# Patient Record
Sex: Male | Born: 1980 | Race: White | Smoking: Never smoker
Health system: Southern US, Community
[De-identification: ages and names within clinical notes are randomized; demographics above are authoritative.]

---

## 2010-09-10 ENCOUNTER — Emergency Department (HOSPITAL_COMMUNITY)
Admission: EM | Admit: 2010-09-10 | Discharge: 2010-09-10 | Disposition: A | Discharge status: Home / Self Care | Payer: Commercial Indemnity | Attending: Emergency Medicine | Admitting: Emergency Medicine

## 2010-09-10 ENCOUNTER — Emergency Department (HOSPITAL_COMMUNITY): Payer: Commercial Indemnity

## 2010-09-10 DIAGNOSIS — IMO0002 Reserved for concepts with insufficient information to code with codable children: Secondary | ICD-10-CM | POA: Insufficient documentation

## 2010-09-10 DIAGNOSIS — R229 Localized swelling, mass and lump, unspecified: Secondary | ICD-10-CM | POA: Insufficient documentation

## 2010-09-10 DIAGNOSIS — S0280XA Fracture of other specified skull and facial bones, unspecified side, initial encounter for closed fracture: Secondary | ICD-10-CM | POA: Insufficient documentation

## 2010-09-10 DIAGNOSIS — S02401A Maxillary fracture, unspecified, initial encounter for closed fracture: Secondary | ICD-10-CM | POA: Insufficient documentation

## 2010-09-10 DIAGNOSIS — R51 Headache: Secondary | ICD-10-CM | POA: Insufficient documentation

## 2010-09-10 DIAGNOSIS — W219XXA Striking against or struck by unspecified sports equipment, initial encounter: Secondary | ICD-10-CM | POA: Insufficient documentation

## 2010-09-10 DIAGNOSIS — Y929 Unspecified place or not applicable: Secondary | ICD-10-CM | POA: Insufficient documentation

## 2010-09-10 DIAGNOSIS — S02400A Malar fracture unspecified, initial encounter for closed fracture: Secondary | ICD-10-CM | POA: Insufficient documentation

## 2012-03-17 IMAGING — CT CT MAXILLOFACIAL W/O CM
3 series · 16 of 47 positions shown, 19 images · non-contrast
Comparison: None

CLINICAL DATA: Hit with softball left eye

CT MAXILLOFACIAL WITHOUT CONTRAST
TECHNIQUE: Multidetector CT imaging of the maxillofacial
structures was performed. Multiplanar CT image reconstructions were
also generated.

[Series 2: facial bones · axial · 0.43mm/px · z∈[+33,+191]mm · 10 of 93 slices shown, 13 images]
[im 7/93  brain]
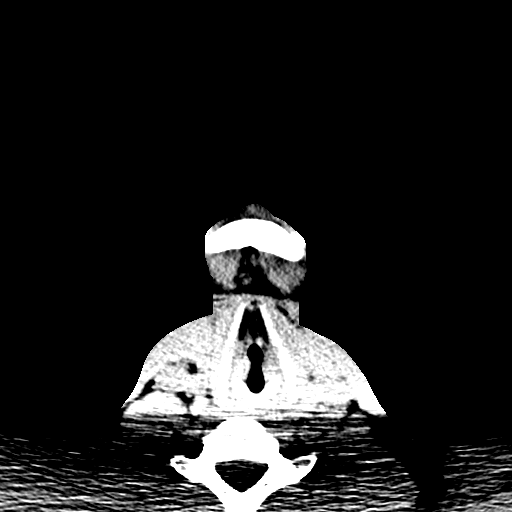
[im 7/93  bone]
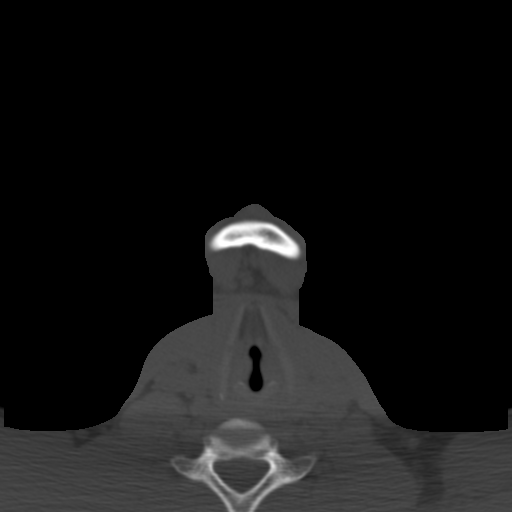
[im 16/93  bone]
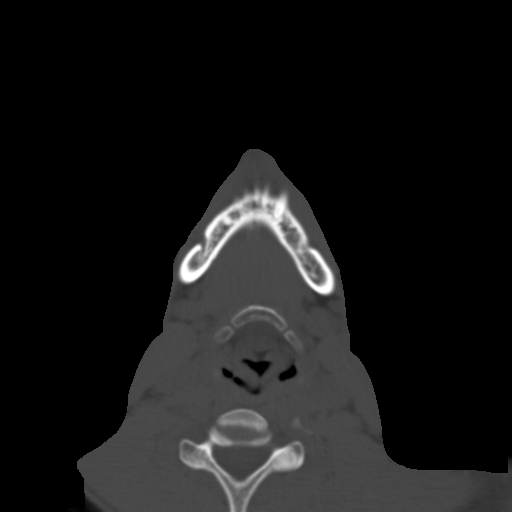
[im 26/93  bone]
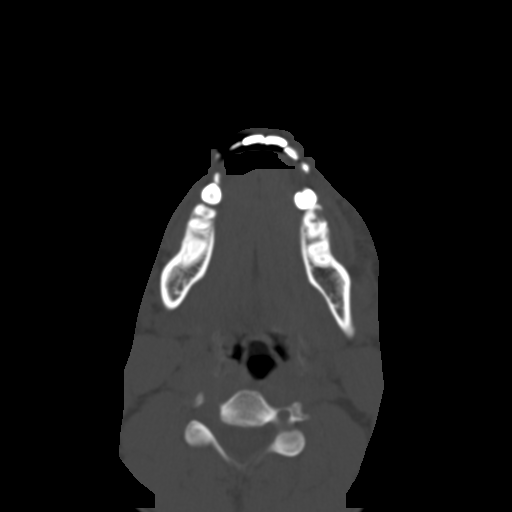
[im 32/93  bone]
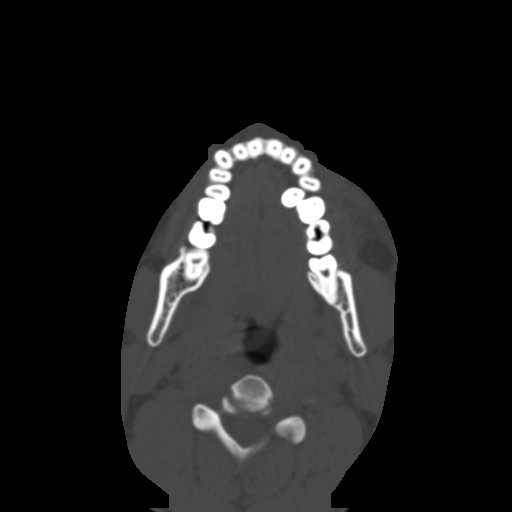
[im 42/93  brain]
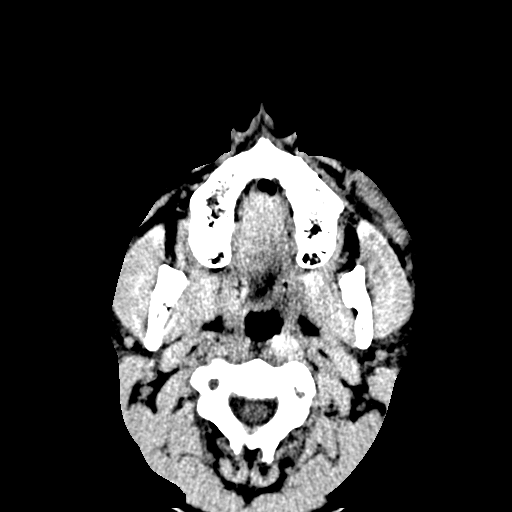
[im 42/93  bone]
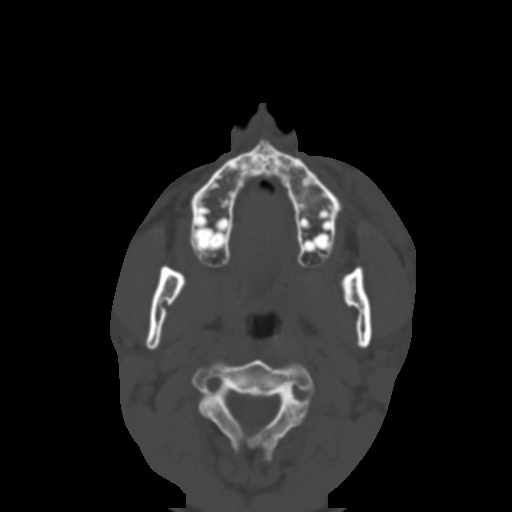
[im 51/93  bone]
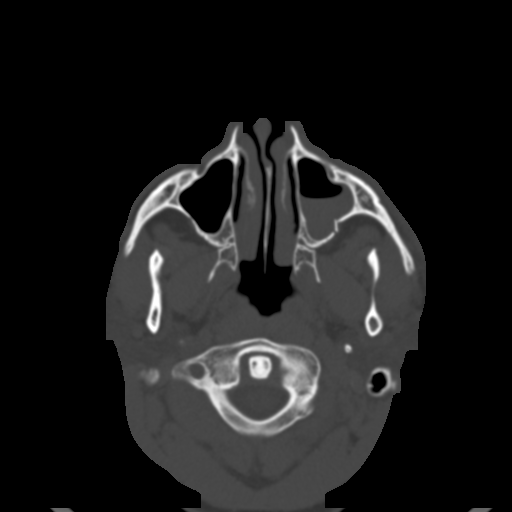
[im 61/93  bone]
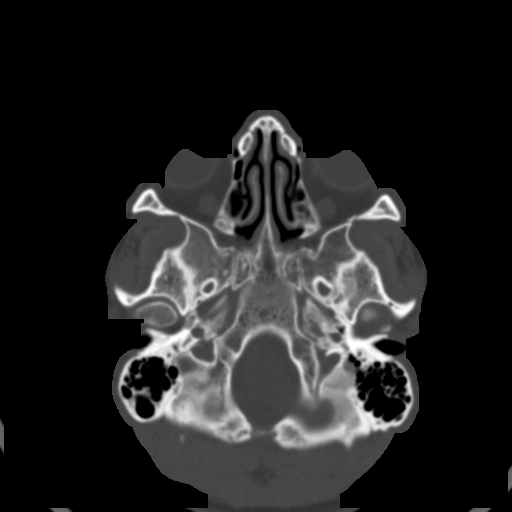
[im 70/93  bone]
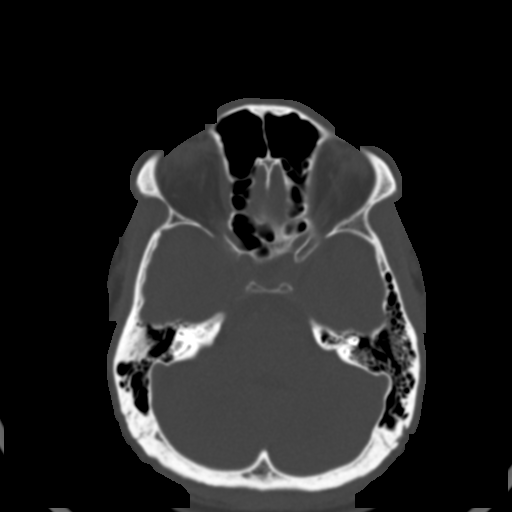
[im 77/93  brain]
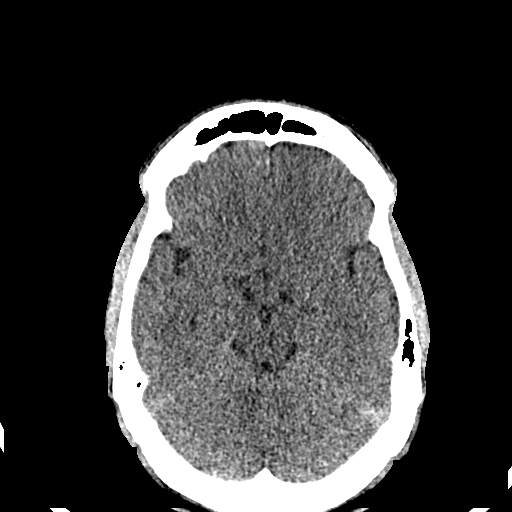
[im 77/93  bone]
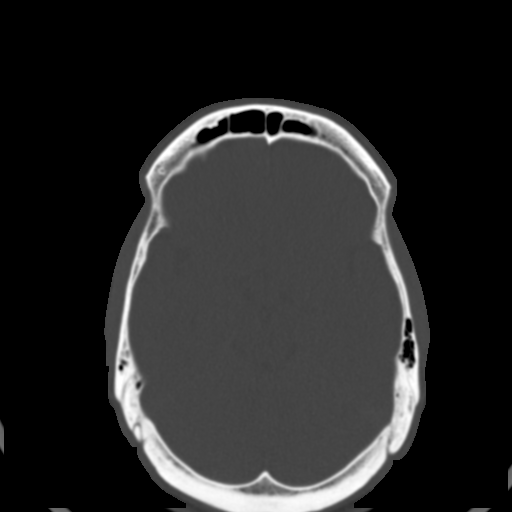
[im 86/93  bone]
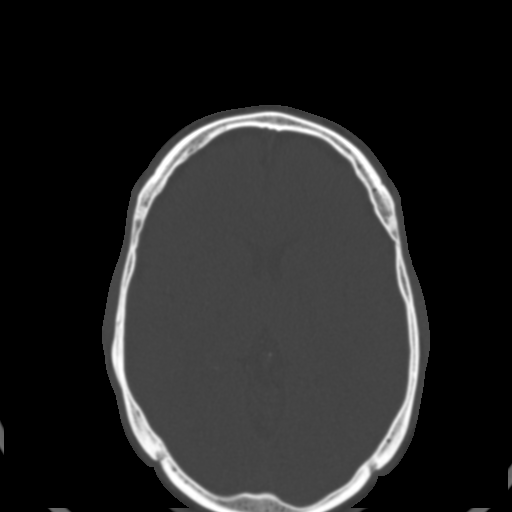

[mpr, coronal std, coronal · coronal · 0.43mm/px · 3 of 88 slices shown]
[im 30/88  bone]
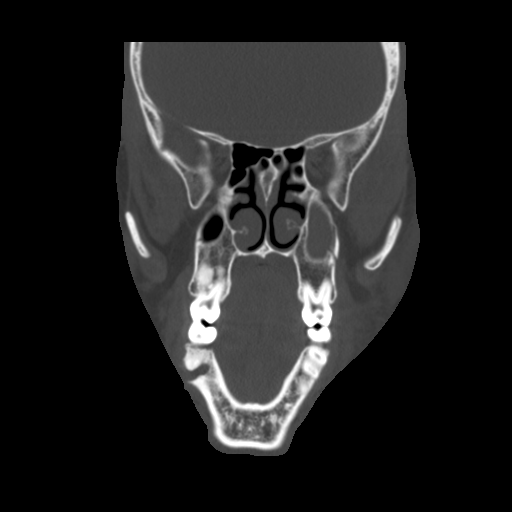
[im 39/88  bone]
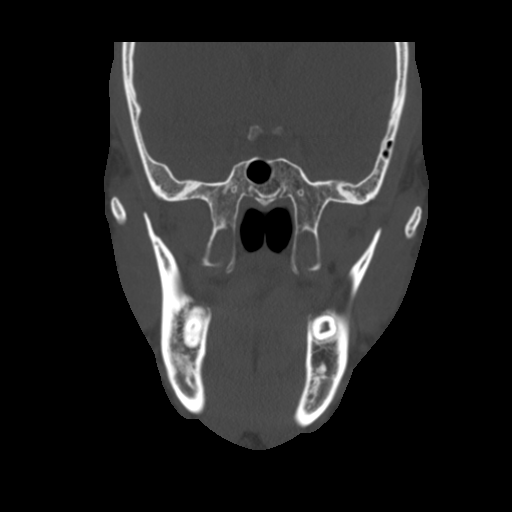
[im 49/88  bone]
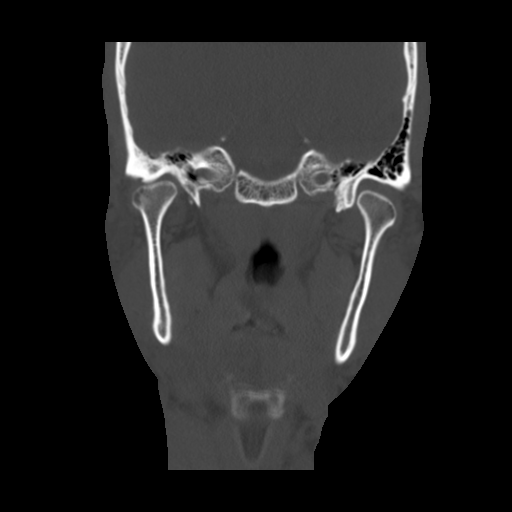

[mpr, sagittal std, sagittal · sagittal · 0.43mm/px · 3 of 71 slices shown]
[im 24/71  bone]
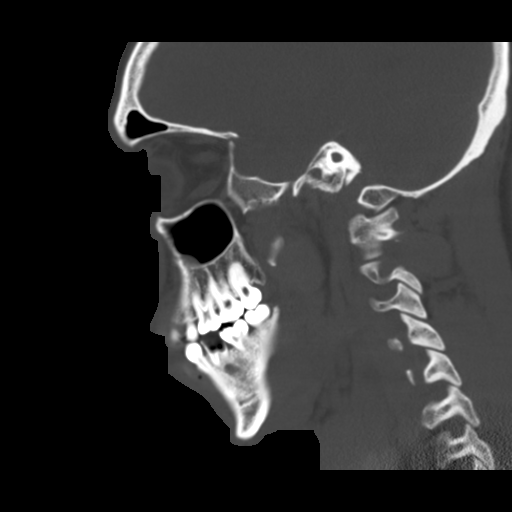
[im 36/71  bone]
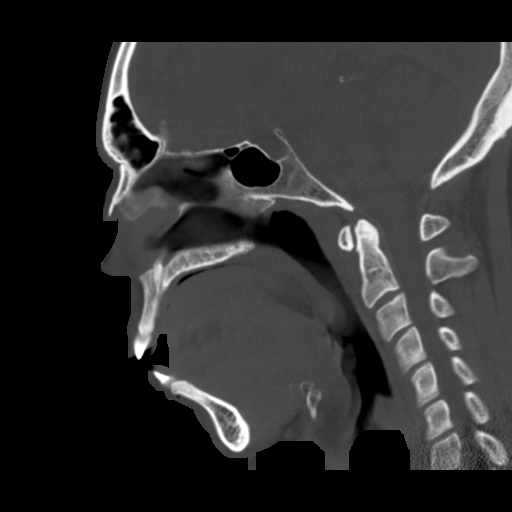
[im 47/71  bone]
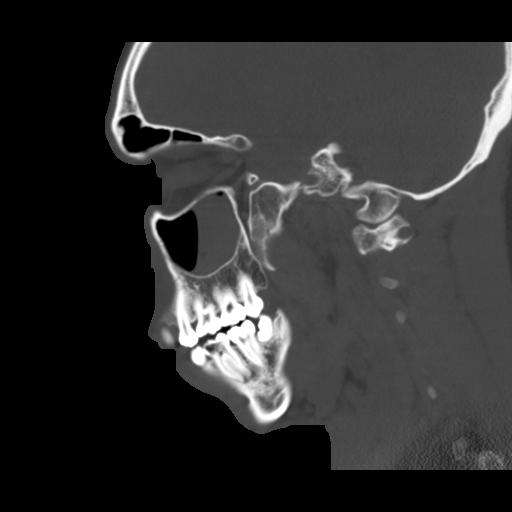

[16 of 47 positions shown; findings below may reference images not displayed]

FINDINGS: Multiple fractures involving the left face.  Nondisplaced
fractures of the zygomatic arch anteriorly and posteriorly.  Mildly
comminuted fracture of the lateral wall of the left maxillary
sinus.  Nondisplaced fracture of the anterior wall left maxillary
sinus.  Nondisplaced fracture of the left lateral orbit.  No
fracture of the orbital floor is identified.  There is  blood in
the left maxillary sinus.

Negative for fracture of the mandible.  Multiple dental caries are
present as well as periapical abscess around right lower molar.
IMPRESSION: Multiple left-sided facial fractures.

Dental disease.

## 2022-09-28 ENCOUNTER — Ambulatory Visit (HOSPITAL_BASED_OUTPATIENT_CLINIC_OR_DEPARTMENT_OTHER): Payer: Commercial Indemnity | Admitting: Family Medicine

## 2022-10-24 ENCOUNTER — Ambulatory Visit (INDEPENDENT_AMBULATORY_CARE_PROVIDER_SITE_OTHER): Payer: BC Managed Care – PPO

## 2022-10-24 ENCOUNTER — Other Ambulatory Visit: Payer: Self-pay

## 2022-10-24 ENCOUNTER — Ambulatory Visit (HOSPITAL_COMMUNITY)
Admission: EM | Admit: 2022-10-24 | Discharge: 2022-10-24 | Disposition: A | Discharge status: Home / Self Care | Payer: BC Managed Care – PPO | Attending: Urgent Care | Admitting: Urgent Care

## 2022-10-24 ENCOUNTER — Encounter (HOSPITAL_COMMUNITY): Payer: Self-pay | Admitting: *Deleted

## 2022-10-24 ENCOUNTER — Inpatient Hospital Stay (HOSPITAL_COMMUNITY)
Admission: RE | Admit: 2022-10-24 | Discharge: 2022-10-24 | Disposition: A | Discharge status: Home / Self Care | Payer: BC Managed Care – PPO | Source: Ambulatory Visit

## 2022-10-24 DIAGNOSIS — R0602 Shortness of breath: Secondary | ICD-10-CM | POA: Insufficient documentation

## 2022-10-24 DIAGNOSIS — R0789 Other chest pain: Secondary | ICD-10-CM

## 2022-10-24 DIAGNOSIS — M5410 Radiculopathy, site unspecified: Secondary | ICD-10-CM | POA: Diagnosis not present

## 2022-10-24 DIAGNOSIS — M792 Neuralgia and neuritis, unspecified: Secondary | ICD-10-CM

## 2022-10-24 DIAGNOSIS — M791 Myalgia, unspecified site: Secondary | ICD-10-CM | POA: Diagnosis not present

## 2022-10-24 DIAGNOSIS — M79602 Pain in left arm: Secondary | ICD-10-CM | POA: Insufficient documentation

## 2022-10-24 DIAGNOSIS — R001 Bradycardia, unspecified: Secondary | ICD-10-CM | POA: Diagnosis not present

## 2022-10-24 LAB — COMPREHENSIVE METABOLIC PANEL
ALT: 26 U/L (ref 0–44)
AST: 22 U/L (ref 15–41)
Albumin: 3.8 g/dL (ref 3.5–5.0)
Alkaline Phosphatase: 53 U/L (ref 38–126)
Anion gap: 7 (ref 5–15)
BUN: 12 mg/dL (ref 6–20)
CO2: 28 mmol/L (ref 22–32)
Calcium: 9 mg/dL (ref 8.9–10.3)
Chloride: 105 mmol/L (ref 98–111)
Creatinine, Ser: 1.1 mg/dL (ref 0.61–1.24)
GFR, Estimated: >60 mL/min (ref >60)
Glucose, Bld: 100 mg/dL — ABNORMAL HIGH (ref 70–99)
Potassium: 4.1 mmol/L (ref 3.5–5.1)
Sodium: 140 mmol/L (ref 135–145)
Total Bilirubin: 1.1 mg/dL (ref 0.3–1.2)
Total Protein: 6.6 g/dL (ref 6.5–8.1)

## 2022-10-24 LAB — CBC WITH DIFFERENTIAL/PLATELET
Abs Immature Granulocytes: 0.01 10*3/uL (ref 0.00–0.07)
Basophils Absolute: 0.1 10*3/uL (ref 0.0–0.1)
Basophils Relative: 1 %
Eosinophils Absolute: 0.1 10*3/uL (ref 0.0–0.5)
Eosinophils Relative: 3 %
HCT: 44.1 % (ref 39.0–52.0)
Hemoglobin: 14.1 g/dL (ref 13.0–17.0)
Immature Granulocytes: 0 %
Lymphocytes Relative: 51 %
Lymphs Abs: 2.1 10*3/uL (ref 0.7–4.0)
MCH: 28.2 pg (ref 26.0–34.0)
MCHC: 32 g/dL (ref 30.0–36.0)
MCV: 88.2 fL (ref 80.0–100.0)
Monocytes Absolute: 0.4 10*3/uL (ref 0.1–1.0)
Monocytes Relative: 9 %
Neutro Abs: 1.5 10*3/uL — ABNORMAL LOW (ref 1.7–7.7)
Neutrophils Relative %: 36 %
Platelets: 258 10*3/uL (ref 150–400)
RBC: 5 MIL/uL (ref 4.22–5.81)
RDW: 13.2 % (ref 11.5–15.5)
WBC: 4.1 10*3/uL (ref 4.0–10.5)
nRBC: 0 % (ref 0.0–0.2)

## 2022-10-24 LAB — TSH: TSH: 1.28 u[IU]/mL (ref 0.350–4.500)

## 2022-10-24 LAB — SEDIMENTATION RATE: Sed Rate: 2 mm/hr (ref 0–16)

## 2022-10-24 LAB — C-REACTIVE PROTEIN: CRP: <0.5 mg/dL (ref <1.0)

## 2022-10-24 MED ORDER — PREDNISONE 10 MG (21) PO TBPK: ORAL_TABLET | Freq: Every day | ORAL | 0 refills | Status: AC

## 2022-10-24 NOTE — Discharge Instructions (Addendum)
Your EKG appears normal. Your chest x-ray does not show any acute findings. We drew labs to further assess the cause of your discomfort today.  Please apply a moist warm compress such as a microwavable heat pack to your anterior chest intermittently throughout the day. Please try the Dosepak of steroids to see if this helps with the swelling and inflammation.  Please establish care with a primary care physician at your earliest convenience to further evaluate your symptoms.  I would recommend a sleep study as obstructive sleep apnea may cause some of your symptoms as well.

## 2022-10-24 NOTE — ED Triage Notes (Signed)
Pt reports on Friday he woke  up in the middle of the night with SHOB ,CP and tingling in Lt arm.

## 2022-10-24 NOTE — ED Provider Notes (Signed)
[facility]    CSN: [id number] Arrival date & time: 10/24/22  0849      History   Chief Complaint Chief Complaint  Patient presents with   Shortness of Breath   Muscle Pain    HPI Shawn Neal is a 42 y.o. male.   42 year old male presents today due to concerns of shortness of breath, chest pain, and numbness tingling down his left arm.  Patient states it has been going on intermittently for the past 2 months he believes.  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  He denies any injury.  He states that the pain is occurring primarily to his sternum bilaterally, and is happening daily.  He reports it as a pressure.  Some days it will last an hour or so, other days it will last all day.  This pressure is associated with a tingling that occurs down his left arm.  It does not extend into his wrist or hand.  He denies weakness of his extremity he is not dropping things.  He states he will not get the tingling and less he is also getting the chest pressure.  Additionally patient states he is intermittently short of breath.  He will have episodes in which he needs to sit up abruptly to improve his shortness of breath sensation.  He states however his shortness of breath will occur at rest.  It is not necessarily exertional or positional.  He denies any recent viral illness.  He denies any recent COVID infection.  He has not had a fever.  He denies a cough.  He has not tried any treatments for this.  He does not smoke.  He is otherwise healthy and has no chronic medical conditions and takes no prescription or over-the-counter medications.   Shortness of Breath Muscle Pain Associated symptoms include shortness of breath.    History reviewed. No pertinent past medical history.  There are no problems to display for this patient.   History reviewed. No pertinent surgical history.     Home Medications    Prior to Admission medications    Medication Sig Start Date End Date Taking? Authorizing Provider  predniSONE (STERAPRED UNI-PAK 21 TAB) 10 MG (21) TBPK tablet Take by mouth daily. Take 6 tabs by mouth daily  for 1 days, then 5 tabs for 1 days, then 4 tabs for 1 days, then 3 tabs for 1 days, 2 tabs for 1 days, then 1 tab by mouth daily for 1 days 10/24/22  Yes Ramie Erman L, PA    Family History History reviewed. No pertinent family history.  Social History Social History   Tobacco Use   Smoking status: Never   Smokeless tobacco: Never     Allergies   Patient has no known allergies.   Review of Systems Review of Systems  Respiratory:  Positive for shortness of breath.   As per HPI   Physical Exam Triage Vital Signs ED Triage Vitals  Enc Vitals Group     BP 10/24/22 0907 126/89     Pulse Rate 10/24/22 0907 (!) 57     Resp 10/24/22 0907 18     Temp 10/24/22 0907 97.8 F (36.6 C)     Temp src --      SpO2 10/24/22 0907 97 %     Weight --      Height --      Head Circumference --      Peak  Flow --      Pain Score 10/24/22 0905 5     Pain Loc --      Pain Edu? --      Excl. in GC? --    No data found.  Updated Vital Signs BP 126/89   Pulse (!) 57   Temp 97.8 F (36.6 C)   Resp 18   SpO2 97%   Visual Acuity Right Eye Distance:   Left Eye Distance:   Bilateral Distance:    Right Eye Near:   Left Eye Near:    Bilateral Near:     Physical Exam Vitals and nursing note reviewed. Exam conducted with a chaperone present.  Constitutional:      General: He is not in acute distress.    Appearance: He is well-developed and normal weight. He is not ill-appearing, toxic-appearing or diaphoretic.  HENT:     Head: Normocephalic and atraumatic.     Right Ear: Tympanic membrane, ear canal and external ear normal. There is no impacted cerumen.     Left Ear: Tympanic membrane, ear canal and external ear normal. There is no impacted cerumen.     Nose: Nose normal. No congestion or rhinorrhea.      Mouth/Throat:     Mouth: Mucous membranes are moist.     Pharynx: Oropharynx is clear. No oropharyngeal exudate or posterior oropharyngeal erythema.     Comments: Mallampati class 3 Eyes:     General: No scleral icterus.       Right eye: No discharge.        Left eye: No discharge.     Extraocular Movements: Extraocular movements intact.     Conjunctiva/sclera: Conjunctivae normal.     Pupils: Pupils are equal, round, and reactive to light.  Cardiovascular:     Rate and Rhythm: Normal rate and regular rhythm.     Heart sounds: No murmur heard.    Comments: HR between 53-61 upon exam Pulmonary:     Effort: Pulmonary effort is normal. No respiratory distress.     Breath sounds: Normal breath sounds. No stridor. No wheezing, rhonchi or rales.  Chest:     Chest wall: Tenderness (reproducible tenderness to B anterior chest/ sternum) present.  Abdominal:     Palpations: Abdomen is soft.     Tenderness: There is no abdominal tenderness.  Musculoskeletal:        General: No swelling. Normal range of motion.     Right shoulder: Normal. No tenderness or bony tenderness. Normal range of motion. Normal strength. Normal pulse.     Left shoulder: Normal. No tenderness or bony tenderness. Normal range of motion. Normal strength. Normal pulse.     Right upper arm: Normal. No swelling, tenderness or bony tenderness.     Left upper arm: Normal. No swelling, tenderness or bony tenderness.     Right wrist: Normal.     Left wrist: Normal. No swelling. Normal pulse.     Right hand: Normal. No tenderness. Normal range of motion. Normal strength. Normal sensation.     Left hand: Normal. No tenderness. Normal range of motion. Normal strength. Normal sensation.     Cervical back: Normal range of motion and neck supple. No rigidity or tenderness.     Right lower leg: No edema.     Left lower leg: No edema.  Lymphadenopathy:     Cervical: No cervical adenopathy.  Skin:    General: Skin is warm and dry.      Capillary  Refill: Capillary refill takes less than 2 seconds.     Coloration: Skin is not jaundiced.     Findings: No bruising, erythema or rash.  Neurological:     General: No focal deficit present.     Mental Status: He is alert and oriented to person, place, and time.     Cranial Nerves: No cranial nerve deficit.     Sensory: No sensory deficit.     Motor: No weakness.     Gait: Gait normal.  Psychiatric:        Mood and Affect: Mood normal.      UC Treatments / Results  Labs (all labs ordered are listed, but only abnormal results are displayed) Labs Reviewed  CBC WITH DIFFERENTIAL/PLATELET  COMPREHENSIVE METABOLIC PANEL  TSH  SEDIMENTATION RATE  C-REACTIVE PROTEIN    EKG   Radiology DG Chest 2 View  Result Date: 10/24/2022 CLINICAL DATA:  Chest pain. EXAM: CHEST - 2 VIEW COMPARISON:  None Available. FINDINGS: The heart size and mediastinal contours are within normal limits. Both lungs are clear. The visualized skeletal structures are unremarkable. IMPRESSION: No active cardiopulmonary disease. Electronically Signed   By: Lupita Raider M.D.   On: 10/24/2022 09:32    Procedures ED EKG  Date/Time: 10/24/2022 10:42 AM  Performed by: Maretta Bees, PA Authorized by: Maretta Bees, PA   Previous ECG:    Previous ECG:  Unavailable Interpretation:    Interpretation: normal   Rate:    ECG rate:  53 Rhythm:    Rhythm: sinus bradycardia   Ectopy:    Ectopy: none   ST segments:    ST segments:  Normal T waves:    T waves: normal    (including critical care time)  Medications Ordered in UC Medications - No data to display  Initial Impression / Assessment and Plan / UC Course  I have reviewed the triage vital signs and the nursing notes.  Pertinent labs & imaging results that were available during my care of the patient were reviewed by me and considered in my medical decision making (see chart for details).     Anterior chest wall pain -with  reproducible symptoms, most fevers costochondritis.  However given his shortness of breath and radicular symptoms as well, EKG and chest x-ray were obtained, both of which showed no acute or concerning findings.  He is not having symptoms consistent with myocarditis or pericarditis.  Will do a short course of prednisone for treat for suspected inflammatory cause.  I also drew inflammatory markers including ESR and CRP. Radicular pain left upper extremity -neurologically intact, no weakness noted.  Will see if prednisone will help with this sx as well, likely inflammation around the brachial plexus causing sx SOB - VSS, PE unremarkable. EKG and CXR normal. Cardiac enlargement not noted, edema not present. No wheezing on exam. Recommended pt establish care with a PCP and consider OSA workup. Wife admits pt has severe snoring   Final Clinical Impressions(s) / UC Diagnoses   Final diagnoses:  Anterior chest wall pain  Radicular pain of left upper extremity  Shortness of breath     Discharge Instructions      Your EKG appears normal. Your chest x-ray does not show any acute findings. We drew labs to further assess the cause of your discomfort today.  Please apply a moist warm compress such as a microwavable heat pack to your anterior chest intermittently throughout the day. Please try the Dosepak of steroids  to see if this helps with the swelling and inflammation.  Please establish care with a primary care physician at your earliest convenience to further evaluate your symptoms.  I would recommend a sleep study as obstructive sleep apnea may cause some of your symptoms as well.     ED Prescriptions     Medication Sig Dispense Auth. Provider   predniSONE (STERAPRED UNI-PAK 21 TAB) 10 MG (21) TBPK tablet Take by mouth daily. Take 6 tabs by mouth daily  for 1 days, then 5 tabs for 1 days, then 4 tabs for 1 days, then 3 tabs for 1 days, 2 tabs for 1 days, then 1 tab by mouth daily for 1 days  21 tablet Duilio Heritage L, PA      PDMP not reviewed this encounter.   Maretta Bees, [place] 10/24/22 1046

## 2022-11-21 ENCOUNTER — Ambulatory Visit: Payer: Self-pay | Admitting: Internal Medicine

## 2023-04-26 ENCOUNTER — Encounter (HOSPITAL_BASED_OUTPATIENT_CLINIC_OR_DEPARTMENT_OTHER): Payer: Self-pay | Admitting: Family Medicine
